# Patient Record
Sex: Male | Born: 1965 | Race: White | Hispanic: No
Health system: Southern US, Community
[De-identification: ages and names within clinical notes are randomized; demographics above are authoritative.]

---

## 2020-04-02 ENCOUNTER — Encounter (HOSPITAL_COMMUNITY): Payer: Self-pay | Admitting: Emergency Medicine

## 2020-04-02 ENCOUNTER — Emergency Department (HOSPITAL_COMMUNITY): Payer: Medicaid Other

## 2020-04-02 ENCOUNTER — Other Ambulatory Visit: Payer: Self-pay

## 2020-04-02 ENCOUNTER — Emergency Department (HOSPITAL_COMMUNITY)
Admission: EM | Admit: 2020-04-02 | Discharge: 2020-04-02 | Disposition: A | Discharge status: Home / Self Care | Payer: Medicaid Other | Attending: Emergency Medicine | Admitting: Emergency Medicine

## 2020-04-02 DIAGNOSIS — M25561 Pain in right knee: Secondary | ICD-10-CM | POA: Diagnosis not present

## 2020-04-02 DIAGNOSIS — G8929 Other chronic pain: Secondary | ICD-10-CM | POA: Insufficient documentation

## 2020-04-02 MED ORDER — MELOXICAM 7.5 MG PO TABS: 7.5000 mg | ORAL_TABLET | Freq: Every day | ORAL | 0 refills | Status: AC

## 2020-04-02 NOTE — ED Provider Notes (Signed)
Seth Delacruz Provider Note   CSN: [id number] Arrival date & time: 04/02/20  1409     History Chief Complaint  Patient presents with  . Knee Pain    Seth Delacruz is a 54 y.o. male.  54 year old male presents with complaint of right knee pain.  Patient states that his knee has been hurting him for the past year, has been worse for the past few days.  Denies falls or injuries.  Patient states that pain is worse if he coughs, feels like his leg is going to give out on him.  Points to medial and lateral joint line dislocation of the pain in his knee.  Denies back pain.  Patient has tried taking Aleve without relief of his pain.  No other complaints or concerns today.        History reviewed. No pertinent past medical history.  There are no problems to display for this patient.   History reviewed. No pertinent surgical history.     No family history on file.  Social History   Tobacco Use  . Smoking status: Not on file  Substance Use Topics  . Alcohol use: Not on file  . Drug use: Not on file    Home Medications Prior to Admission medications   Medication Sig Start Date End Date Taking? Authorizing Provider  meloxicam (MOBIC) 7.5 MG tablet Take 1 tablet (7.5 mg total) by mouth daily. 04/02/20   Jeannie Fend, PA-C    Allergies    Patient has no known allergies.  Review of Systems   Review of Systems  Constitutional: Negative for fever.  Musculoskeletal: Positive for arthralgias. Negative for gait problem and joint swelling.  Skin: Negative for color change, rash and wound.  Allergic/Immunologic: Negative for immunocompromised state.  Neurological: Negative for weakness and numbness.    Physical Exam Updated Vital Signs BP 123/89   Pulse 81   Temp 98 F (36.7 C) (Oral)   Resp 16   SpO2 97%   Physical Exam Vitals and nursing note reviewed.  Constitutional:      General: He is not in acute distress.    Appearance:  He is well-developed. He is not diaphoretic.  HENT:     Head: Normocephalic and atraumatic.  Cardiovascular:     Pulses: Normal pulses.  Pulmonary:     Effort: Pulmonary effort is normal.  Musculoskeletal:        General: No swelling, tenderness or deformity. Normal range of motion.     Right knee: No swelling, deformity, effusion, erythema, ecchymosis, bony tenderness or crepitus. Normal range of motion. No tenderness. Normal patellar mobility. Normal pulse.     Right lower leg: No edema.     Left lower leg: No edema.  Skin:    General: Skin is warm and dry.     Findings: No erythema or rash.  Neurological:     Mental Status: He is alert and oriented to person, place, and time.     Sensory: No sensory deficit.     Motor: No weakness.  Psychiatric:        Behavior: Behavior normal.     ED Results / Procedures / Treatments   Labs (all labs ordered are listed, but only abnormal results are displayed) Labs Reviewed - No data to display  EKG None  Radiology DG Knee Complete 4 Views Right  Result Date: 04/02/2020 CLINICAL DATA:  Right knee pain. EXAM: RIGHT KNEE - COMPLETE 4+ VIEW COMPARISON:  None. FINDINGS:  No evidence of fracture, dislocation, or joint effusion. No evidence of arthropathy or other focal bone abnormality. Soft tissues are unremarkable. IMPRESSION: Negative. Electronically Signed   By: Lupita Raider M.D.   On: 04/02/2020 15:12    Procedures Procedures (including critical care time)  Medications Ordered in ED Medications - No data to display  ED Course  I have reviewed the triage vital signs and the nursing notes.  Pertinent labs & imaging results that were available during my care of the patient were reviewed by me and considered in my medical decision making (see chart for details).  Clinical Course as of Apr 02 1709  Sat Apr 02, 2020  [id number] 54 year old male with complaint of acute on chronic right knee pain, not improving with Aleve at home.  Exam  is unremarkable.  X-ray unremarkable. Plan is to discharge with prescription for meloxicam, advised to discontinue the Aleve.   [LM]    Clinical Course User Index [LM] Alden Hipp   MDM Rules/Calculators/A&P                          Final Clinical Impression(s) / ED Diagnoses Final diagnoses:  Chronic pain of right knee    Rx / DC Orders ED Discharge Orders         Ordered    meloxicam (MOBIC) 7.5 MG tablet  Daily        04/02/20 1710           Alden Hipp 04/02/20 1710    Little, Ambrose Finland, MD 04/02/20 1723

## 2020-04-02 NOTE — ED Triage Notes (Signed)
Patient c/o right knee pain x1 year worsening x 3 weeks. Denies injury.

## 2020-04-02 NOTE — Discharge Instructions (Addendum)
Discontinue Aleve.  Take meloxicam as prescribed daily as needed for pain.  You can also alternate warm and cold compresses for 20 minutes at a time.

## 2022-05-15 IMAGING — CR DG KNEE COMPLETE 4+V*R*
4 series · 4 of 4 positions shown · non-contrast
Comparison: None.

CLINICAL DATA: Right knee pain.

EXAM:
RIGHT KNEE - COMPLETE 4+ VIEW

[t knee ap right]
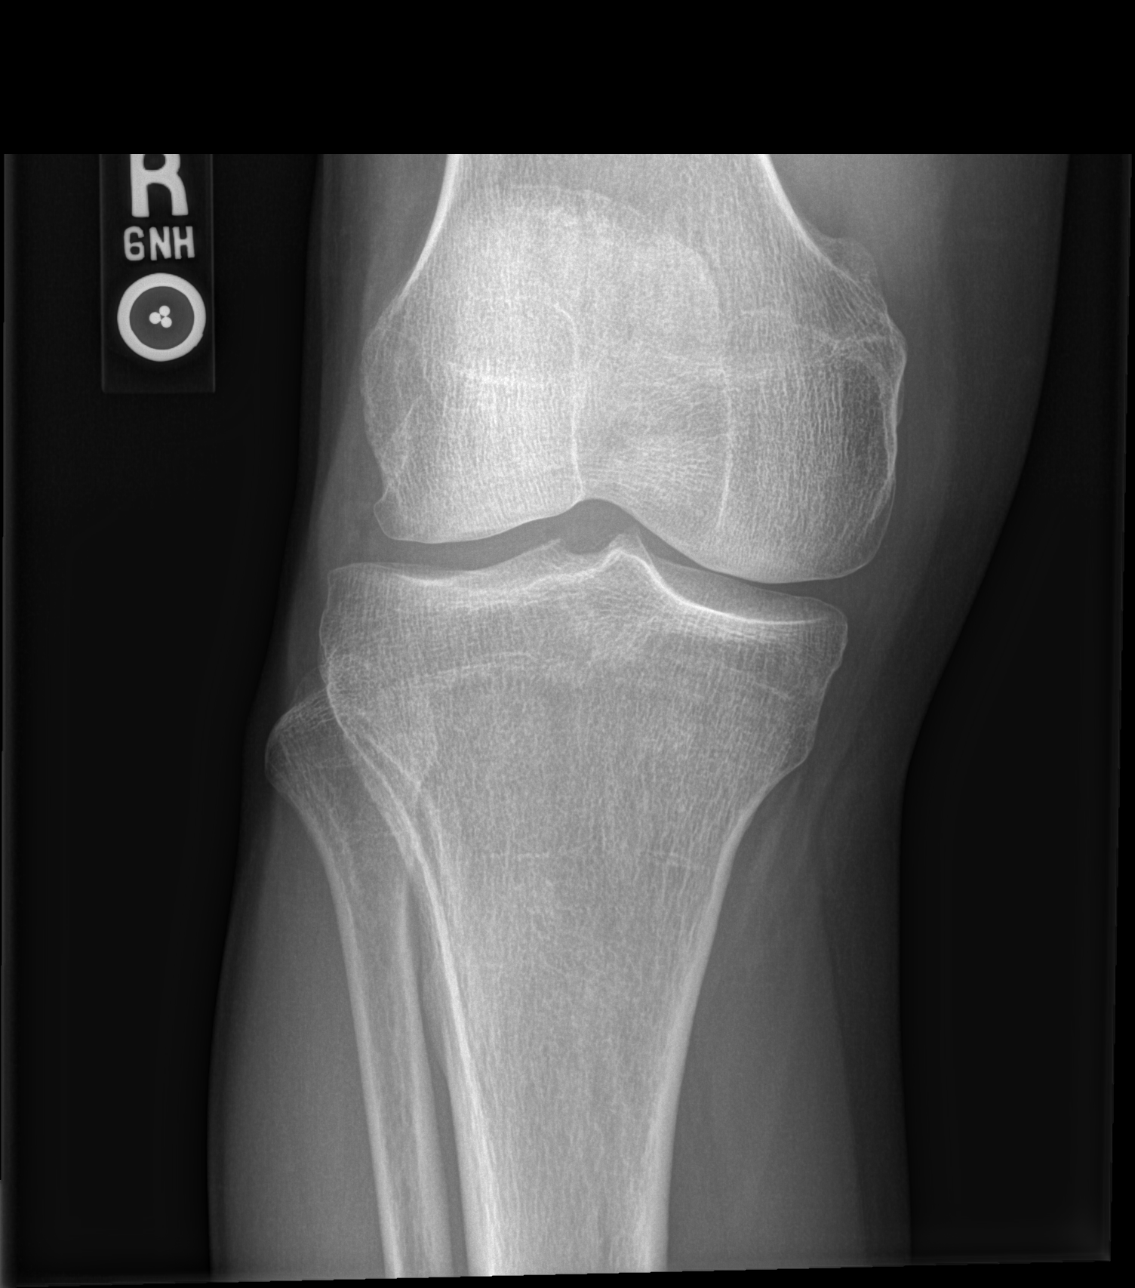

[t knee obl right (1 of 2)]
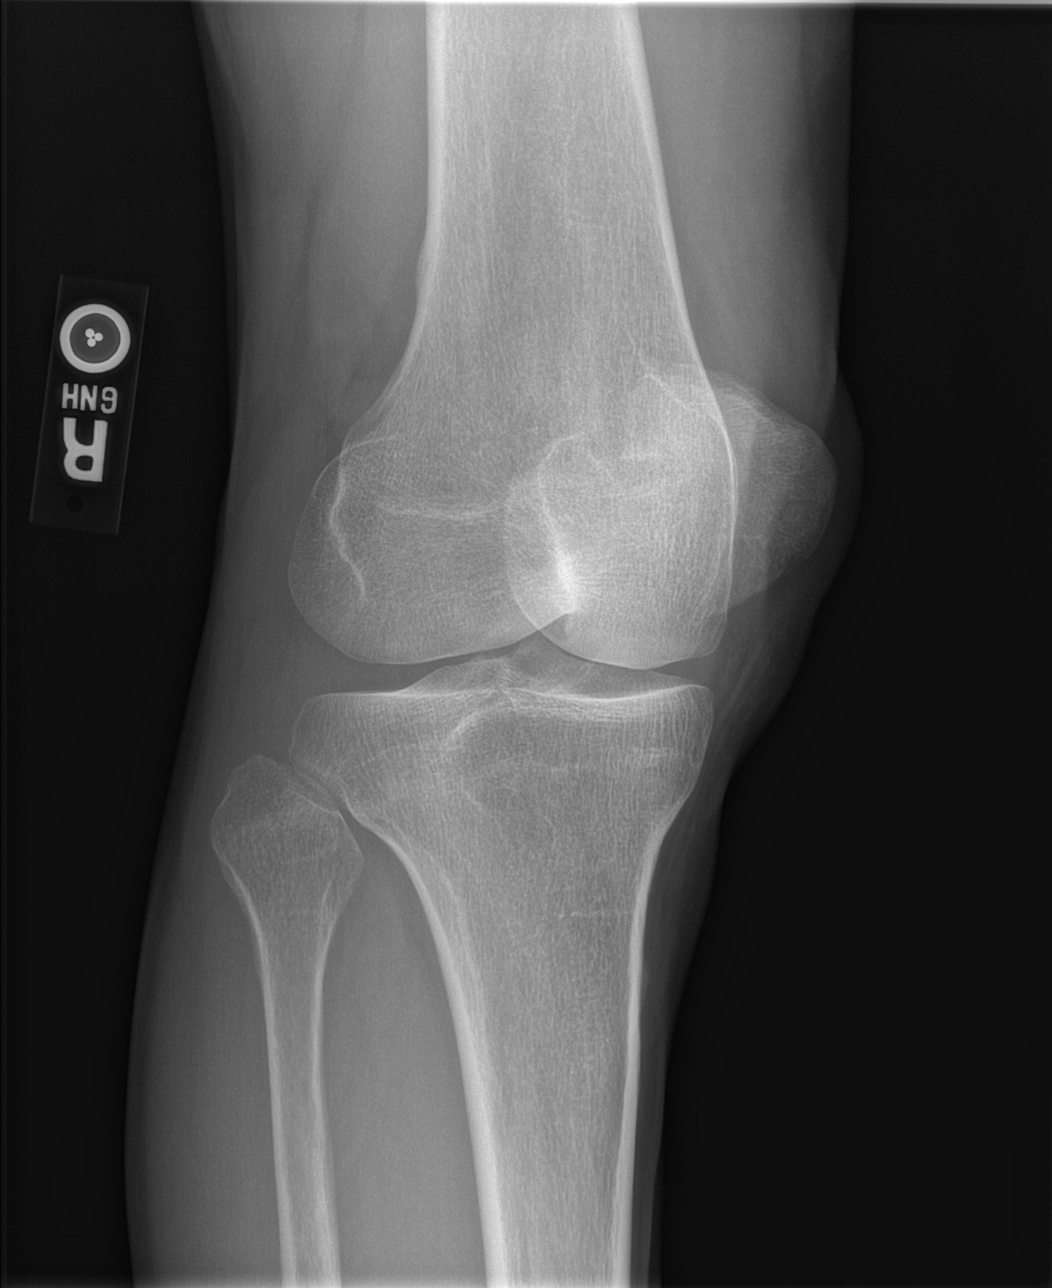

[t knee obl right (2 of 2)]
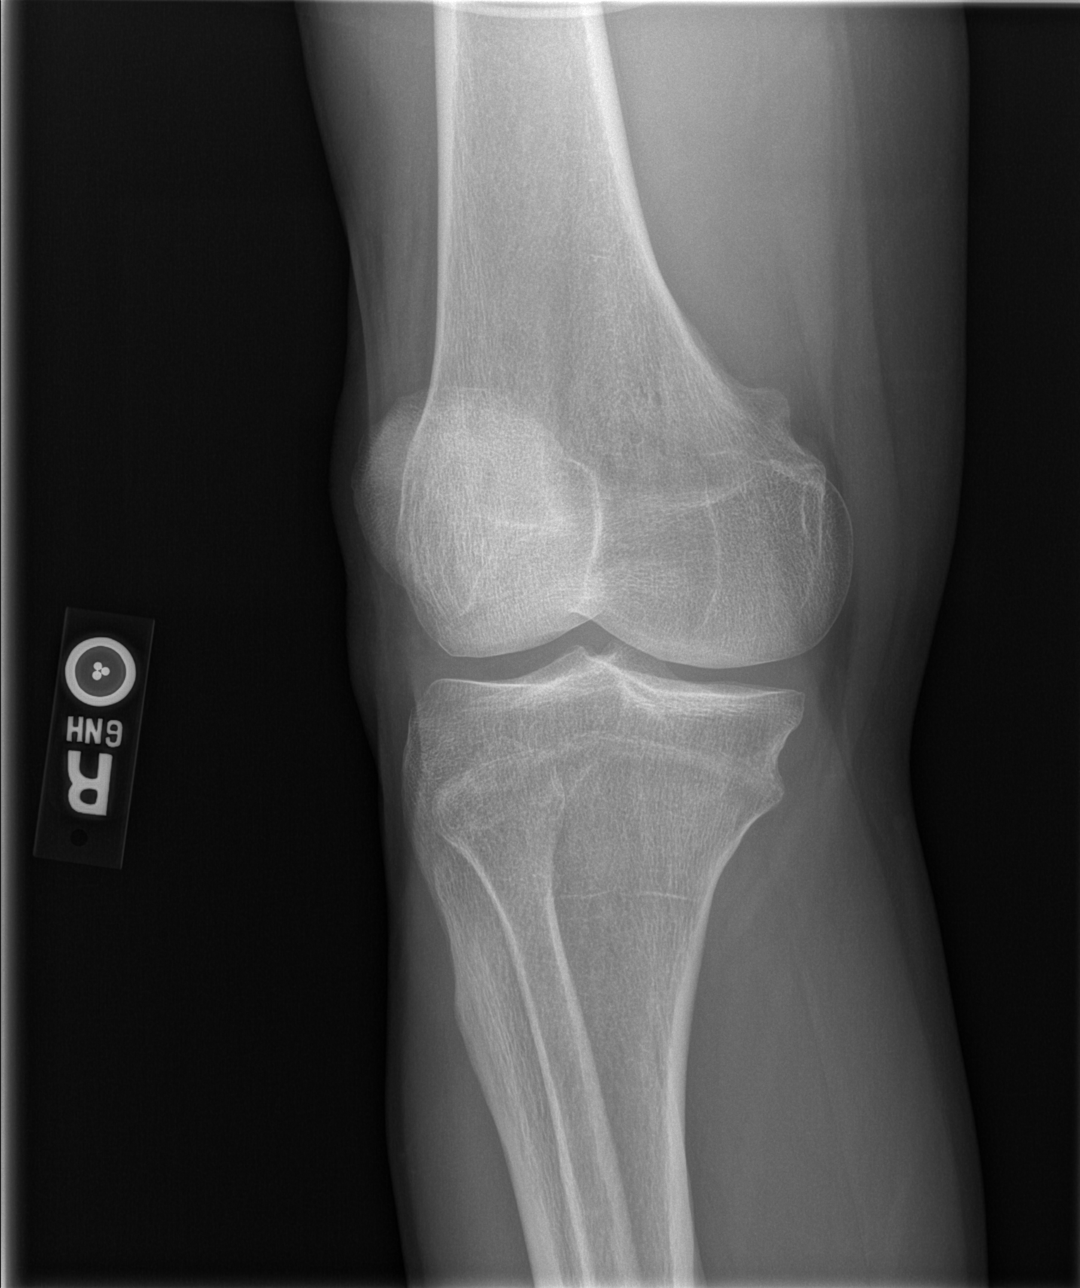

[t knee lat right]
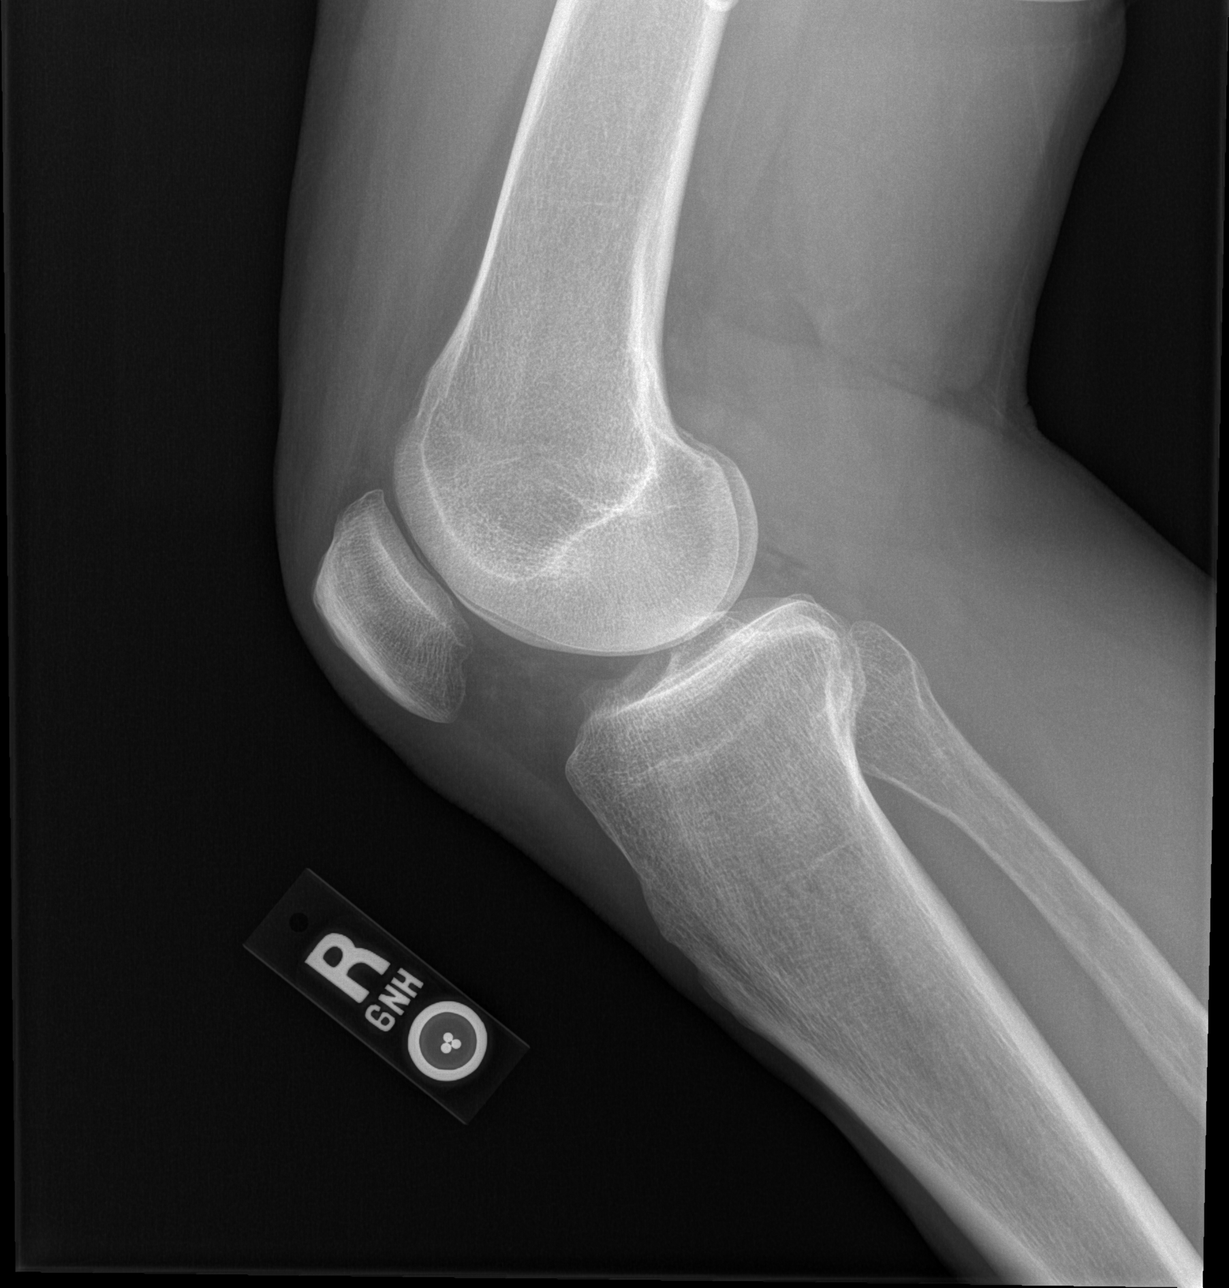

[4 of 4 positions shown; findings below may reference images not displayed]

FINDINGS: No evidence of fracture, dislocation, or joint effusion. No evidence
of arthropathy or other focal bone abnormality. Soft tissues are
unremarkable.
IMPRESSION: Negative.
